# Patient Record
Sex: Male | Born: 1995 | Race: White | Hispanic: No | Marital: Single | Smoking: Never smoker
Health system: Southern US, Community
[De-identification: ages and names within clinical notes are randomized; demographics above are authoritative.]

---

## 2014-07-15 ENCOUNTER — Encounter (HOSPITAL_COMMUNITY): Payer: Self-pay | Admitting: Oncology

## 2014-07-15 ENCOUNTER — Emergency Department (HOSPITAL_COMMUNITY)
Admission: EM | Admit: 2014-07-15 | Discharge: 2014-07-16 | Disposition: A | Discharge status: Home / Self Care | Payer: BLUE CROSS/BLUE SHIELD | Attending: Emergency Medicine | Admitting: Emergency Medicine

## 2014-07-15 ENCOUNTER — Emergency Department (HOSPITAL_COMMUNITY): Payer: BLUE CROSS/BLUE SHIELD

## 2014-07-15 DIAGNOSIS — Y9389 Activity, other specified: Secondary | ICD-10-CM | POA: Insufficient documentation

## 2014-07-15 DIAGNOSIS — Y998 Other external cause status: Secondary | ICD-10-CM | POA: Diagnosis not present

## 2014-07-15 DIAGNOSIS — S61412A Laceration without foreign body of left hand, initial encounter: Secondary | ICD-10-CM | POA: Diagnosis not present

## 2014-07-15 DIAGNOSIS — W228XXA Striking against or struck by other objects, initial encounter: Secondary | ICD-10-CM | POA: Insufficient documentation

## 2014-07-15 DIAGNOSIS — Y9289 Other specified places as the place of occurrence of the external cause: Secondary | ICD-10-CM | POA: Insufficient documentation

## 2014-07-15 DIAGNOSIS — S6992XA Unspecified injury of left wrist, hand and finger(s), initial encounter: Secondary | ICD-10-CM | POA: Diagnosis present

## 2014-07-15 NOTE — ED Notes (Signed)
Per pt his emotions were running high and he open hand hit a mirror resulting in a laceration to his left hand.  Minimal bleeding at this time.

## 2014-07-16 MED ORDER — TETANUS-DIPHTH-ACELL PERTUSSIS 5-2.5-18.5 LF-MCG/0.5 IM SUSP: 0.5000 mL | Freq: Once | INTRAMUSCULAR | Status: DC

## 2014-07-16 MED ORDER — LIDOCAINE HCL 2 % IJ SOLN
5.0000 mL | Freq: Once | INTRAMUSCULAR | Status: AC
Start: 2014-07-16 — End: 2014-07-16
  Administered 2014-07-16: 100 mg via INTRADERMAL
  Filled 2014-07-16: qty 20

## 2014-07-16 NOTE — ED Provider Notes (Signed)
CSN: [id number]     Arrival date & time 07/15/14  2334 History   First MD Initiated Contact with Patient 07/15/14 2343     Chief Complaint  Patient presents with  . Extremity Laceration     (Consider location/radiation/quality/duration/timing/severity/associated sxs/prior Treatment) HPI Comments: 19 year old male presenting with a laceration to his left hand occurring about an hour prior to arrival. Patient reports "his emotions were running high", he got frustrated and hit a meter. Denies suicidal or homicidal ideation. Denies numbness or tingling. States the wound is stinging. No aggravating or alleviating factors. Unsure of last tetanus.  The history is provided by the patient.    History reviewed. No pertinent past medical history. History reviewed. No pertinent past surgical history. History reviewed. No pertinent family history. History  Substance Use Topics  . Smoking status: Never Smoker   . Smokeless tobacco: Not on file  . Alcohol Use: No    Review of Systems  Constitutional: Negative.   HENT: Negative.   Respiratory: Negative.   Cardiovascular: Negative.   Gastrointestinal: Negative for nausea.  Musculoskeletal: Negative.   Skin: Positive for wound.  Neurological: Negative for numbness.      Allergies  Review of patient's allergies indicates no known allergies.  Home Medications   Prior to Admission medications   Not on File   BP 142/74 mmHg  Pulse 84  Temp(Src) 98.9 F (37.2 C) (Oral)  Resp 20  SpO2 99% Physical Exam  Constitutional: He is oriented to person, place, and time. He appears well-developed and well-nourished. No distress.  HENT:  Head: Normocephalic and atraumatic.  Eyes: Conjunctivae and EOM are normal.  Neck: Normal range of motion. Neck supple.  Cardiovascular: Normal rate, regular rhythm and normal heart sounds.   Pulmonary/Chest: Effort normal and breath sounds normal.  Musculoskeletal: Normal range of motion. He exhibits no  edema.       Hands: FROM of all digits of left hand. Full flexion and extension at DIP, PIP and MCP. Cap refill < 3 seconds.  Neurological: He is alert and oriented to person, place, and time.  Skin: Skin is warm and dry.  Psychiatric: He has a normal mood and affect. His behavior is normal.  Nursing note and vitals reviewed.   ED Course  Procedures (including critical care time) LACERATION REPAIR Performed by: Celene Skeen Authorized by: Celene Skeen Consent: Verbal consent obtained. Risks and benefits: risks, benefits and alternatives were discussed Consent given by: patient Patient identity confirmed: provided demographic data Prepped and Draped in normal sterile fashion Wound explored  Laceration Location: left hand  Laceration Length: 2 cm  No Foreign Bodies seen or palpated  Anesthesia: local infiltration  Local anesthetic: lidocaine 2% without epinephrine  Anesthetic total: 2 ml  Irrigation method: syringe Amount of cleaning: standard  Skin closure: 4-0 prolene  Number of sutures: 4  Technique: simple interrupted  Patient tolerance: Patient tolerated the procedure well with no immediate complications.  Labs Review Labs Reviewed - No data to display  Imaging Review Dg Hand Complete Left  07/15/2014   CLINICAL DATA:  Extremity laceration.  Initial encounter.  EXAM: LEFT HAND - COMPLETE 3+ VIEW  COMPARISON:  None.  FINDINGS: Gas in the ventral middle finger may reflect the site of laceration. There is no evidence of fracture or opaque foreign body. No malalignment.  IMPRESSION: No fracture or opaque foreign body.   Electronically Signed   By: Tiburcio Pea M.D.   On: 07/15/2014 23:59     EKG  Interpretation None      MDM   Final diagnoses:  Hand laceration, left, initial encounter   Neurovascularly intact. No foreign bodies seen on exam or on x-ray. No evidence of tendon disruption. Laceration repaired. Tetanus updated. Stable for discharge. Return  precautions given. Patient states understanding of treatment care plan and is agreeable.  Kathrynn SpeedRobyn M Jon Lall, PA-C 07/16/14 [id number]  Merrie RoofJohn David Wofford III, MD 07/16/14 [phone]

## 2014-07-16 NOTE — Discharge Instructions (Signed)
Follow up at the urgent care at your school or any urgent care in 7 days for suture removal. Keep the wound clean and dry.  Laceration Care, Adult A laceration is a cut or lesion that goes through all layers of the skin and into the tissue just beneath the skin. TREATMENT  Some lacerations may not require closure. Some lacerations may not be able to be closed due to an increased risk of infection. It is important to see your caregiver as soon as possible after an injury to minimize the risk of infection and maximize the opportunity for successful closure. If closure is appropriate, pain medicines may be given, if needed. The wound will be cleaned to help prevent infection. Your caregiver will use stitches (sutures), staples, wound glue (adhesive), or skin adhesive strips to repair the laceration. These tools bring the skin edges together to allow for faster healing and a better cosmetic outcome. However, all wounds will heal with a scar. Once the wound has healed, scarring can be minimized by covering the wound with sunscreen during the day for 1 full year. HOME CARE INSTRUCTIONS  For sutures or staples:  Keep the wound clean and dry.  If you were given a bandage (dressing), you should change it at least once a day. Also, change the dressing if it becomes wet or dirty, or as directed by your caregiver.  Wash the wound with soap and water 2 times a day. Rinse the wound off with water to remove all soap. Pat the wound dry with a clean towel.  After cleaning, apply a thin layer of the antibiotic ointment as recommended by your caregiver. This will help prevent infection and keep the dressing from sticking.  You may shower as usual after the first 24 hours. Do not soak the wound in water until the sutures are removed.  Only take over-the-counter or prescription medicines for pain, discomfort, or fever as directed by your caregiver.  Get your sutures or staples removed as directed by your  caregiver. For skin adhesive strips:  Keep the wound clean and dry.  Do not get the skin adhesive strips wet. You may bathe carefully, using caution to keep the wound dry.  If the wound gets wet, pat it dry with a clean towel.  Skin adhesive strips will fall off on their own. You may trim the strips as the wound heals. Do not remove skin adhesive strips that are still stuck to the wound. They will fall off in time. For wound adhesive:  You may briefly wet your wound in the shower or bath. Do not soak or scrub the wound. Do not swim. Avoid periods of heavy perspiration until the skin adhesive has fallen off on its own. After showering or bathing, gently pat the wound dry with a clean towel.  Do not apply liquid medicine, cream medicine, or ointment medicine to your wound while the skin adhesive is in place. This may loosen the film before your wound is healed.  If a dressing is placed over the wound, be careful not to apply tape directly over the skin adhesive. This may cause the adhesive to be pulled off before the wound is healed.  Avoid prolonged exposure to sunlight or tanning lamps while the skin adhesive is in place. Exposure to ultraviolet light in the first year will darken the scar.  The skin adhesive will usually remain in place for 5 to 10 days, then naturally fall off the skin. Do not pick at the adhesive  film. You may need a tetanus shot if:  You cannot remember when you had your last tetanus shot.  You have never had a tetanus shot. If you get a tetanus shot, your arm may swell, get red, and feel warm to the touch. This is common and not a problem. If you need a tetanus shot and you choose not to have one, there is a rare chance of getting tetanus. Sickness from tetanus can be serious. SEEK MEDICAL CARE IF:   You have redness, swelling, or increasing pain in the wound.  You see a red line that goes away from the wound.  You have yellowish-white fluid (pus) coming from  the wound.  You have a fever.  You notice a bad smell coming from the wound or dressing.  Your wound breaks open before or after sutures have been removed.  You notice something coming out of the wound such as wood or glass.  Your wound is on your hand or foot and you cannot move a finger or toe. SEEK IMMEDIATE MEDICAL CARE IF:   Your pain is not controlled with prescribed medicine.  You have severe swelling around the wound causing pain and numbness or a change in color in your arm, hand, leg, or foot.  Your wound splits open and starts bleeding.  You have worsening numbness, weakness, or loss of function of any joint around or beyond the wound.  You develop painful lumps near the wound or on the skin anywhere on your body. MAKE SURE YOU:   Understand these instructions.  Will watch your condition.  Will get help right away if you are not doing well or get worse. Document Released: 06/10/2005 Document Revised: 09/02/2011 Document Reviewed: 12/04/2010 [facility] Patient Information 2015 [place], [place]. This information is not intended to replace advice given to you by your health care provider. Make sure you discuss any questions you have with your health care provider.

## 2015-12-22 IMAGING — CR DG HAND COMPLETE 3+V*L*
3 series · 3 of 3 positions shown · non-contrast
Comparison: None.

CLINICAL DATA: Extremity laceration.  Initial encounter.

EXAM:
LEFT HAND - COMPLETE 3+ VIEW

[x hand pa left]
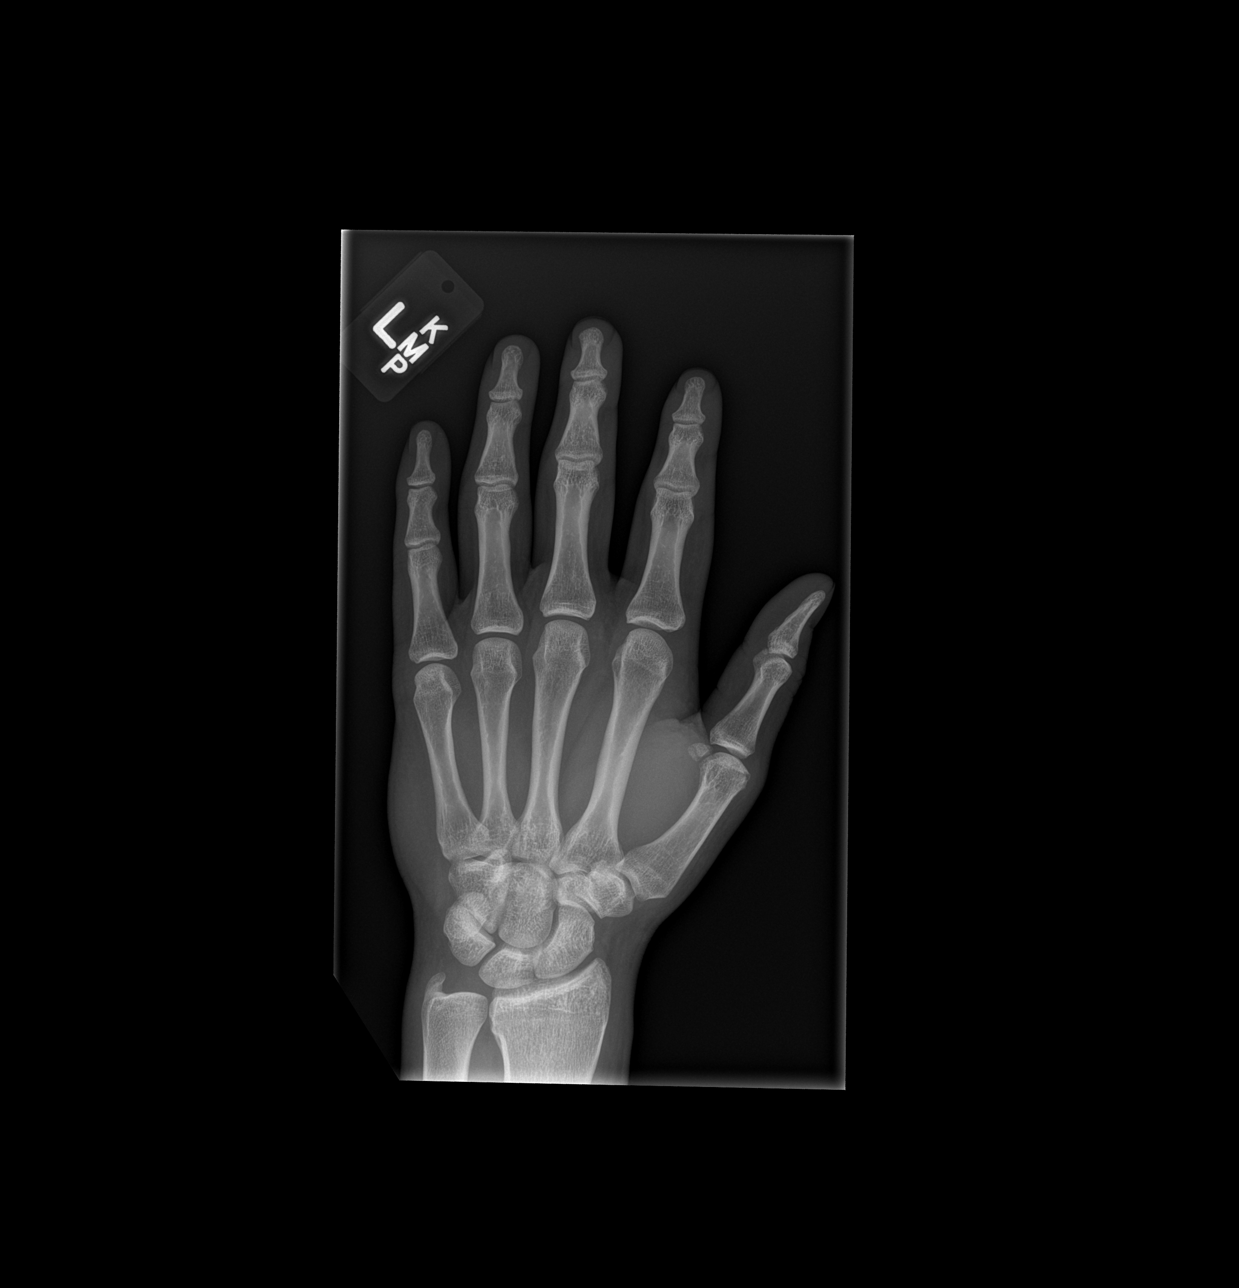

[x hand obl left]
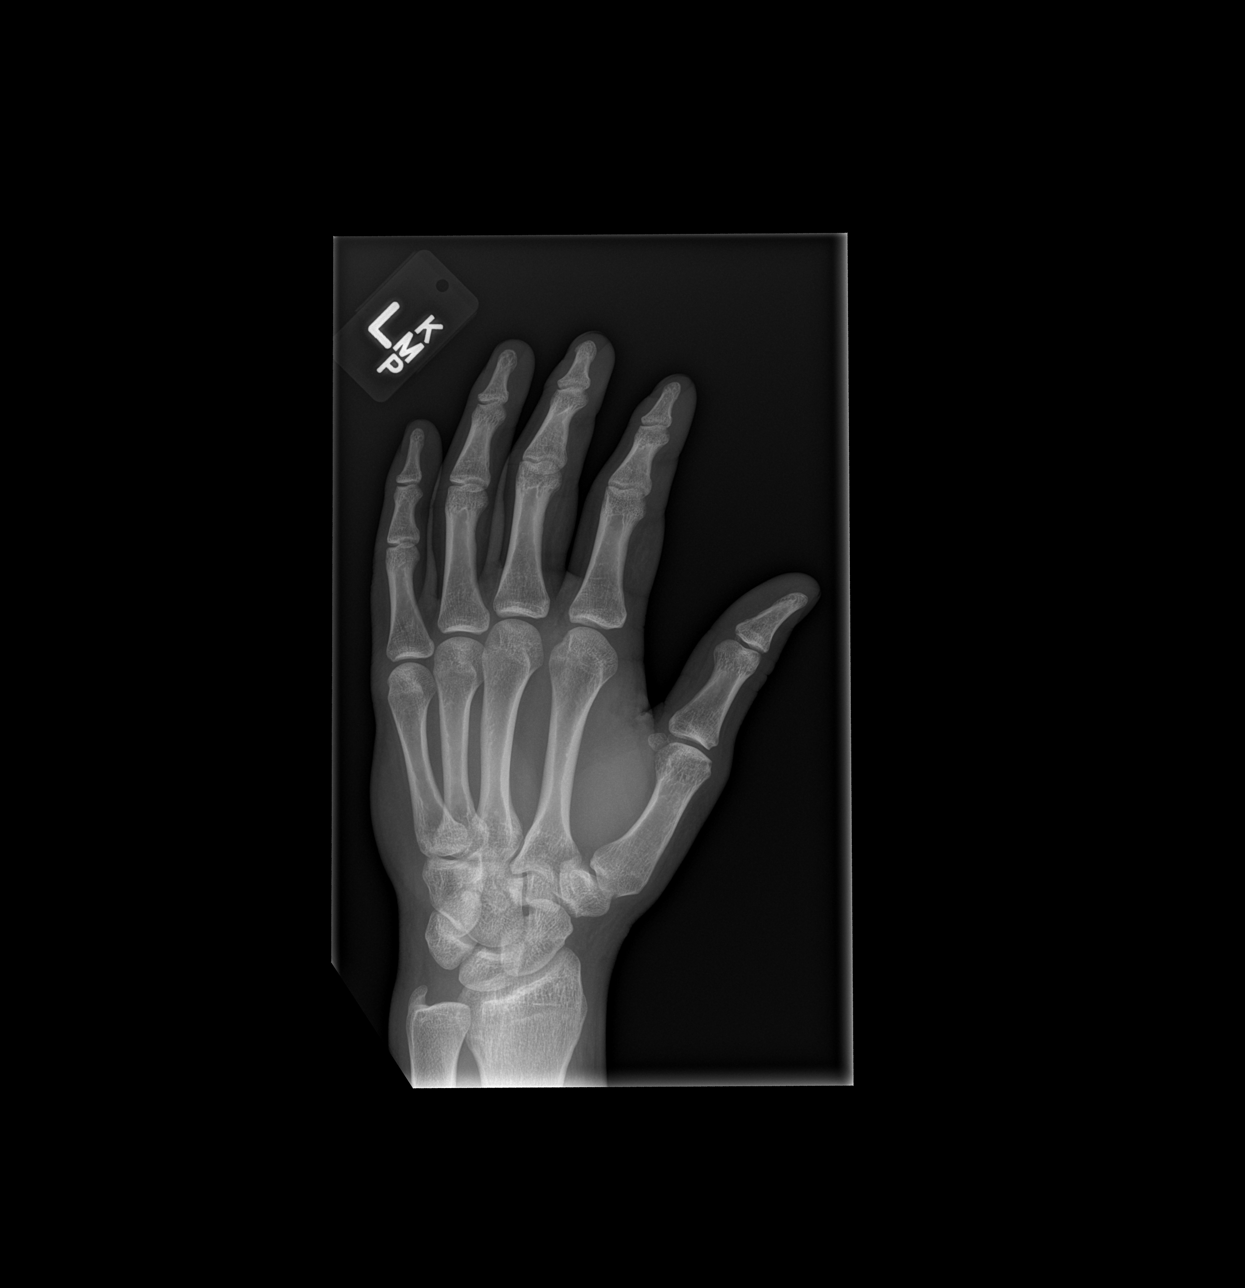

[x hand lat left]
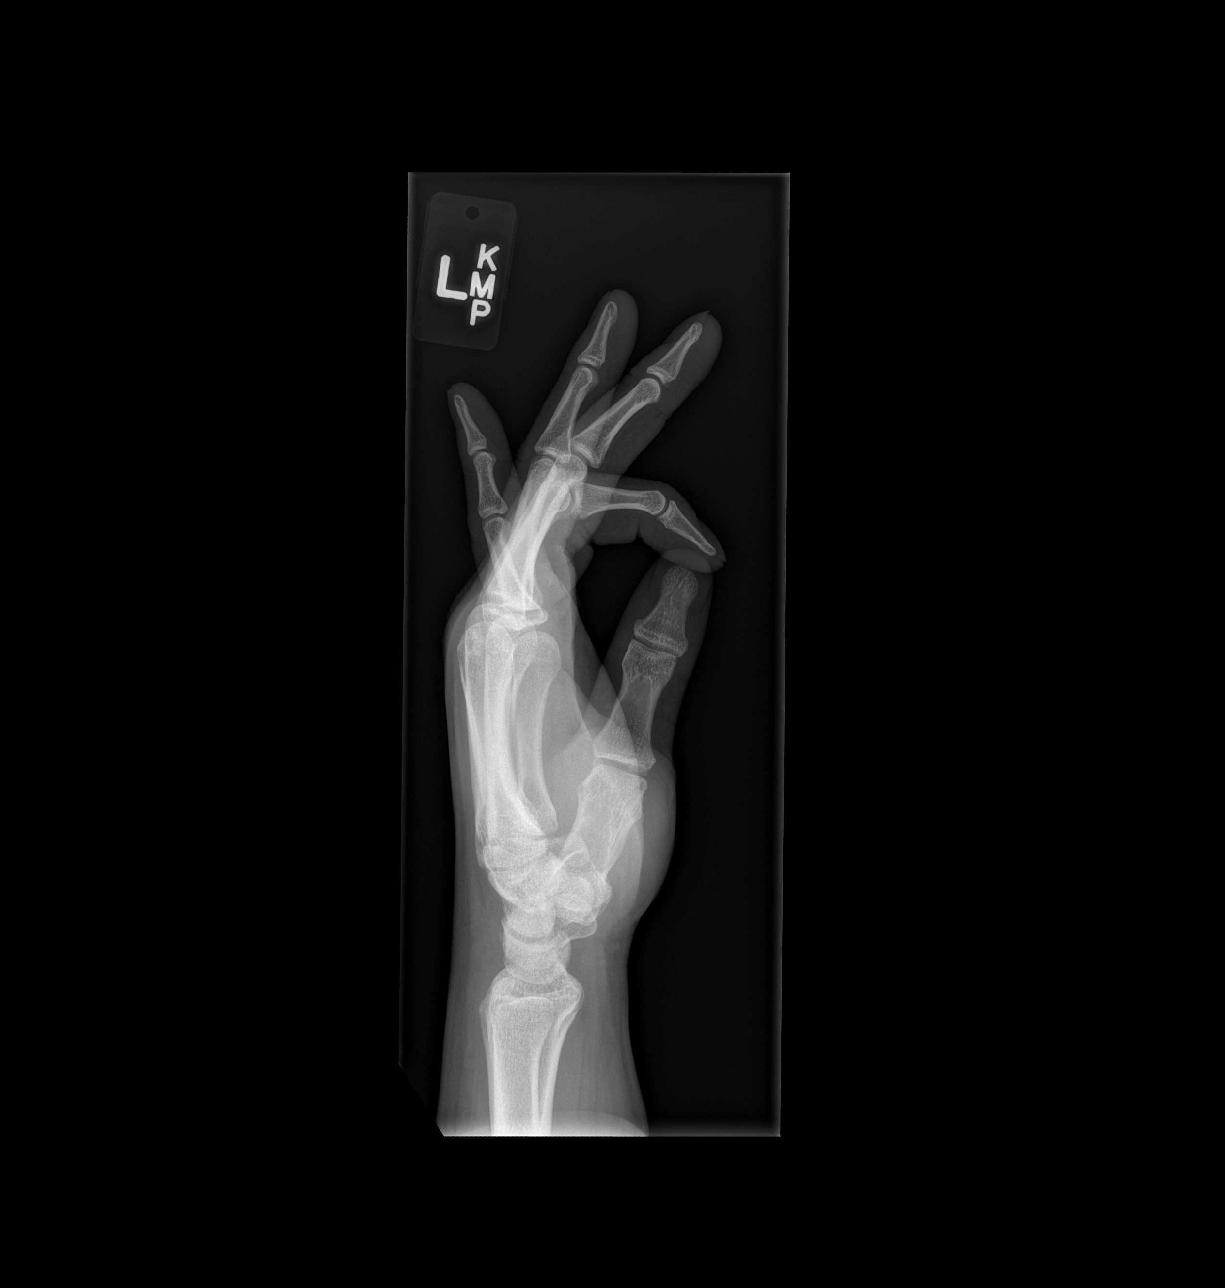

[3 of 3 positions shown; findings below may reference images not displayed]

FINDINGS: Gas in the ventral middle finger may reflect the site of laceration.
There is no evidence of fracture or opaque foreign body. No
malalignment.
IMPRESSION: No fracture or opaque foreign body.

## 2017-05-20 ENCOUNTER — Ambulatory Visit (INDEPENDENT_AMBULATORY_CARE_PROVIDER_SITE_OTHER): Payer: BLUE CROSS/BLUE SHIELD | Admitting: Family Medicine

## 2017-05-20 ENCOUNTER — Encounter: Payer: Self-pay | Admitting: Family Medicine

## 2017-05-20 VITALS — BP 120/80 | HR 55 | Ht 71.5 in | Wt 226.0 lb

## 2017-05-20 DIAGNOSIS — Z6830 Body mass index (BMI) 30.0-30.9, adult: Secondary | ICD-10-CM | POA: Diagnosis not present

## 2017-05-20 DIAGNOSIS — E669 Obesity, unspecified: Secondary | ICD-10-CM | POA: Diagnosis not present

## 2017-05-20 DIAGNOSIS — Z0001 Encounter for general adult medical examination with abnormal findings: Secondary | ICD-10-CM

## 2017-05-20 DIAGNOSIS — Z23 Encounter for immunization: Secondary | ICD-10-CM | POA: Diagnosis not present

## 2017-05-20 NOTE — Progress Notes (Signed)
Subjective:  Victor Perez is a 21 y.o. male who presents today for his annual comprehensive physical exam and to establish care.   HPI:  Needs sports physical for the [employer].  He recently moved to [place] to [place] about 6 years ago for college.  No family history of sudden cardiac death.  No chest pain or shortness of breath while exercising.  No joint pain while exercising.  Victor Perez has no acute complaints today.   Lifestyle Diet: Healthy. Balanced. Plenty of fruits and vegetables. Exercise: Work out. Does cross fit.   Depression screen PHQ 2/9 05/20/2017  Decreased Interest 0  Down, Depressed, Hopeless 0  PHQ - 2 Score 0   Health Maintenance Due  Topic Date Due  . HIV Screening  11/09/2008  . TETANUS/TDAP  11/09/2012    ROS: A14 point review of systems was performed and was negative  PMH:  The following were reviewed and entered/updated in epic: History reviewed. No pertinent past medical history. Patient Active Problem List   Diagnosis Date Noted  . Obesity 05/20/2017   History reviewed. No pertinent surgical history.  History reviewed. No pertinent family history.  Medications- reviewed and updated No current outpatient medications on file.   No current facility-administered medications for this visit.     Allergies-reviewed and updated No Known Allergies  Social History   Socioeconomic History  . Marital status: Single    Spouse name: None  . Number of children: 0  . Years of education: None  . Highest education level: None  Social Needs  . Financial resource strain: None  . Food insecurity - worry: None  . Food insecurity - inability: None  . Transportation needs - medical: None  . Transportation needs - non-medical: None  Occupational History  . Occupation: Social research officer, [employer]ore Manager    Comment: [employer]  Tobacco Use  . Smoking status: Never Smoker  . Smokeless tobacco: Never Used  Substance and Sexual Activity  . Alcohol use: Yes  . Drug  use: No  . Sexual activity: Yes  Other Topics Concern  . None  Social History Narrative  . None   Objective:  Physical Exam: BP 120/80 (BP Location: Left Arm, Cuff Size: Large)   Pulse (!) 55   Ht 5' 11.5" (1.816 m)   Wt 226 lb (102.5 kg)   SpO2 98%   BMI 31.08 kg/m   Body mass index is 31.08 kg/m. Gen: NAD, resting comfortably HEENT: TMs normal bilaterally. OP clear. No thyromegaly noted.  CV: RRR with no murmurs appreciated Pulm: NWOB, CTAB with no crackles, wheezes, or rhonchi GI: Normal bowel sounds present. Soft, Nontender, Nondistended. MSK: no edema, cyanosis, or clubbing noted Skin: warm, dry Neuro: CN2-12 grossly intact. Strength 5/5 in upper and lower extremities. Reflexes symmetric and intact bilaterally.  Psych: Normal affect and thought content  Assessment/Plan:  Sports physical form Completed for patient today.  Preventative Healthcare: Tdap given.  Flu shot deferred.  Patient Counseling:  -Nutrition: Stressed importance of moderation in sodium/caffeine intake, saturated fat and cholesterol, caloric balance, sufficient intake of fresh fruits, vegetables, and fiber.  -Stressed the importance of regular exercise.   -Substance Abuse: Discussed cessation/primary prevention of tobacco, alcohol, or other drug use; driving or other dangerous activities under the influence; availability of treatment for abuse.   -Injury prevention: Discussed safety belts, safety helmets, smoke detector, smoking near bedding or upholstery.   -Sexuality: Discussed sexually transmitted diseases, partner selection, use of condoms, avoidance of unintended pregnancy and contraceptive  alternatives.   -Dental health: Discussed importance of regular tooth brushing, flossing, and dental visits.  -Health maintenance and immunizations reviewed. Please refer to Health maintenance section.  Return to care in 1 year for next preventative visit.   Katina Degreealeb M. Jimmey RalphParker, MD 05/20/2017 12:29 PM
